# Patient Record
Sex: Female | Born: 1970 | Race: White | Hispanic: No | Marital: Married
Health system: Southern US, Community
[De-identification: ages and names within clinical notes are randomized; demographics above are authoritative.]

---

## 2007-11-26 ENCOUNTER — Encounter: Admission: RE | Admit: 2007-11-26 | Discharge: 2007-11-26 | Payer: Self-pay | Admitting: Obstetrics & Gynecology

## 2011-09-07 ENCOUNTER — Other Ambulatory Visit: Payer: Self-pay | Admitting: Family Medicine

## 2011-09-07 DIAGNOSIS — Z1231 Encounter for screening mammogram for malignant neoplasm of breast: Secondary | ICD-10-CM

## 2011-09-20 ENCOUNTER — Ambulatory Visit: Payer: Self-pay

## 2011-09-29 ENCOUNTER — Ambulatory Visit
Admission: RE | Admit: 2011-09-29 | Discharge: 2011-09-29 | Disposition: A | Discharge status: Home / Self Care | Payer: 59 | Source: Ambulatory Visit | Attending: Family Medicine | Admitting: Family Medicine

## 2011-09-29 DIAGNOSIS — Z1231 Encounter for screening mammogram for malignant neoplasm of breast: Secondary | ICD-10-CM

## 2012-10-07 ENCOUNTER — Other Ambulatory Visit: Payer: Self-pay

## 2012-10-07 DIAGNOSIS — Z1231 Encounter for screening mammogram for malignant neoplasm of breast: Secondary | ICD-10-CM

## 2012-11-05 ENCOUNTER — Ambulatory Visit
Admission: RE | Admit: 2012-11-05 | Discharge: 2012-11-05 | Disposition: A | Discharge status: Home / Self Care | Payer: 59 | Source: Ambulatory Visit

## 2012-11-05 DIAGNOSIS — Z1231 Encounter for screening mammogram for malignant neoplasm of breast: Secondary | ICD-10-CM

## 2013-12-23 ENCOUNTER — Other Ambulatory Visit: Payer: Self-pay

## 2013-12-23 DIAGNOSIS — Z1231 Encounter for screening mammogram for malignant neoplasm of breast: Secondary | ICD-10-CM

## 2013-12-24 ENCOUNTER — Ambulatory Visit
Admission: RE | Admit: 2013-12-24 | Discharge: 2013-12-24 | Disposition: A | Discharge status: Home / Self Care | Payer: 59 | Source: Ambulatory Visit

## 2013-12-24 ENCOUNTER — Encounter (INDEPENDENT_AMBULATORY_CARE_PROVIDER_SITE_OTHER): Payer: Self-pay

## 2013-12-24 DIAGNOSIS — Z1231 Encounter for screening mammogram for malignant neoplasm of breast: Secondary | ICD-10-CM

## 2013-12-30 ENCOUNTER — Other Ambulatory Visit: Payer: Self-pay | Admitting: Lactation Services

## 2013-12-30 ENCOUNTER — Other Ambulatory Visit: Payer: Self-pay | Admitting: Family Medicine

## 2013-12-30 DIAGNOSIS — R928 Other abnormal and inconclusive findings on diagnostic imaging of breast: Secondary | ICD-10-CM

## 2014-01-05 ENCOUNTER — Other Ambulatory Visit: Payer: Self-pay | Admitting: Family Medicine

## 2014-01-05 DIAGNOSIS — R928 Other abnormal and inconclusive findings on diagnostic imaging of breast: Secondary | ICD-10-CM

## 2014-01-07 ENCOUNTER — Ambulatory Visit
Admission: RE | Admit: 2014-01-07 | Discharge: 2014-01-07 | Disposition: A | Discharge status: Home / Self Care | Payer: BC Managed Care – PPO | Source: Ambulatory Visit | Attending: Family Medicine | Admitting: Family Medicine

## 2014-01-07 DIAGNOSIS — R928 Other abnormal and inconclusive findings on diagnostic imaging of breast: Secondary | ICD-10-CM

## 2015-02-23 ENCOUNTER — Other Ambulatory Visit: Payer: Self-pay

## 2015-02-23 DIAGNOSIS — Z1231 Encounter for screening mammogram for malignant neoplasm of breast: Secondary | ICD-10-CM

## 2015-03-04 ENCOUNTER — Ambulatory Visit: Payer: Self-pay

## 2015-03-09 ENCOUNTER — Ambulatory Visit
Admission: RE | Admit: 2015-03-09 | Discharge: 2015-03-09 | Disposition: A | Discharge status: Home / Self Care | Payer: BLUE CROSS/BLUE SHIELD | Source: Ambulatory Visit

## 2015-03-09 DIAGNOSIS — Z1231 Encounter for screening mammogram for malignant neoplasm of breast: Secondary | ICD-10-CM

## 2016-05-01 ENCOUNTER — Other Ambulatory Visit: Payer: Self-pay | Admitting: Nurse Practitioner

## 2016-05-01 DIAGNOSIS — Z1231 Encounter for screening mammogram for malignant neoplasm of breast: Secondary | ICD-10-CM

## 2016-05-29 ENCOUNTER — Ambulatory Visit: Payer: BLUE CROSS/BLUE SHIELD

## 2016-07-10 ENCOUNTER — Ambulatory Visit: Payer: BLUE CROSS/BLUE SHIELD

## 2016-07-14 ENCOUNTER — Ambulatory Visit: Payer: BLUE CROSS/BLUE SHIELD

## 2016-07-25 ENCOUNTER — Ambulatory Visit
Admission: RE | Admit: 2016-07-25 | Discharge: 2016-07-25 | Disposition: A | Discharge status: Home / Self Care | Payer: BLUE CROSS/BLUE SHIELD | Source: Ambulatory Visit | Attending: Nurse Practitioner | Admitting: Nurse Practitioner

## 2016-07-25 DIAGNOSIS — Z1231 Encounter for screening mammogram for malignant neoplasm of breast: Secondary | ICD-10-CM

## 2017-08-03 ENCOUNTER — Other Ambulatory Visit: Payer: Self-pay | Admitting: Nurse Practitioner

## 2017-08-03 DIAGNOSIS — Z1231 Encounter for screening mammogram for malignant neoplasm of breast: Secondary | ICD-10-CM

## 2017-08-31 ENCOUNTER — Ambulatory Visit
Admission: RE | Admit: 2017-08-31 | Discharge: 2017-08-31 | Disposition: A | Discharge status: Home / Self Care | Payer: BLUE CROSS/BLUE SHIELD | Source: Ambulatory Visit | Attending: Nurse Practitioner | Admitting: Nurse Practitioner

## 2017-08-31 DIAGNOSIS — Z1231 Encounter for screening mammogram for malignant neoplasm of breast: Secondary | ICD-10-CM

## 2018-11-20 ENCOUNTER — Other Ambulatory Visit: Payer: Self-pay | Admitting: Family Medicine

## 2018-11-20 DIAGNOSIS — Z1231 Encounter for screening mammogram for malignant neoplasm of breast: Secondary | ICD-10-CM

## 2018-12-21 ENCOUNTER — Other Ambulatory Visit: Payer: Self-pay

## 2018-12-21 ENCOUNTER — Ambulatory Visit
Admission: RE | Admit: 2018-12-21 | Discharge: 2018-12-21 | Disposition: A | Discharge status: Home / Self Care | Payer: 59 | Source: Ambulatory Visit | Attending: Family Medicine | Admitting: Family Medicine

## 2018-12-21 DIAGNOSIS — Z1231 Encounter for screening mammogram for malignant neoplasm of breast: Secondary | ICD-10-CM

## 2020-10-25 IMAGING — MG DIGITAL SCREENING BILATERAL MAMMOGRAM WITH TOMO AND CAD
8 series · 8 of 24 positions shown · non-contrast
Comparison: Previous exam(s).

CLINICAL DATA: Screening.

EXAM:
DIGITAL SCREENING BILATERAL MAMMOGRAM WITH TOMO AND CAD

[L MLO synth-2D]
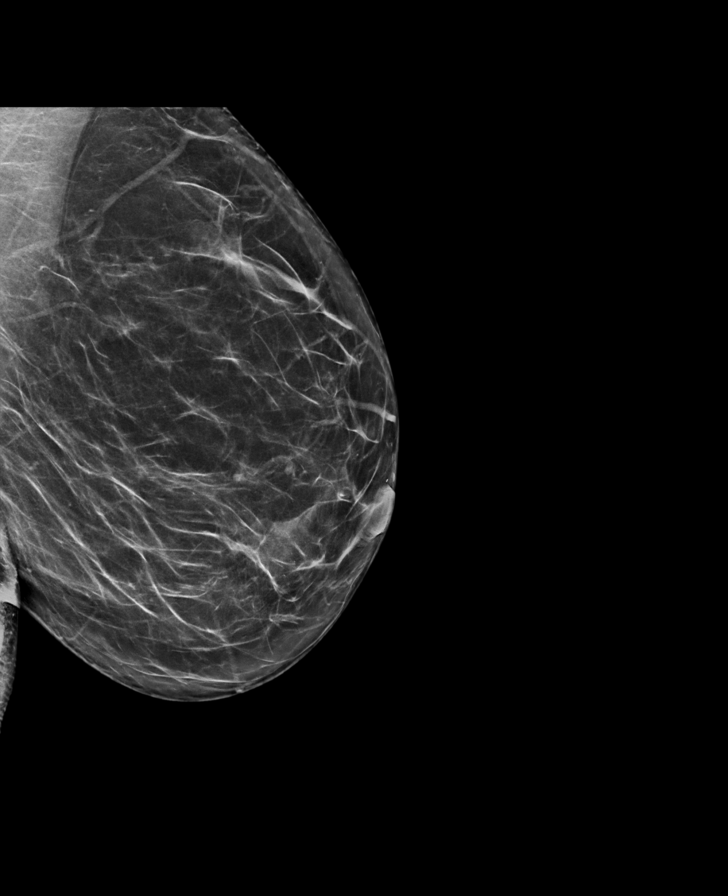

[R MLO synth-2D]
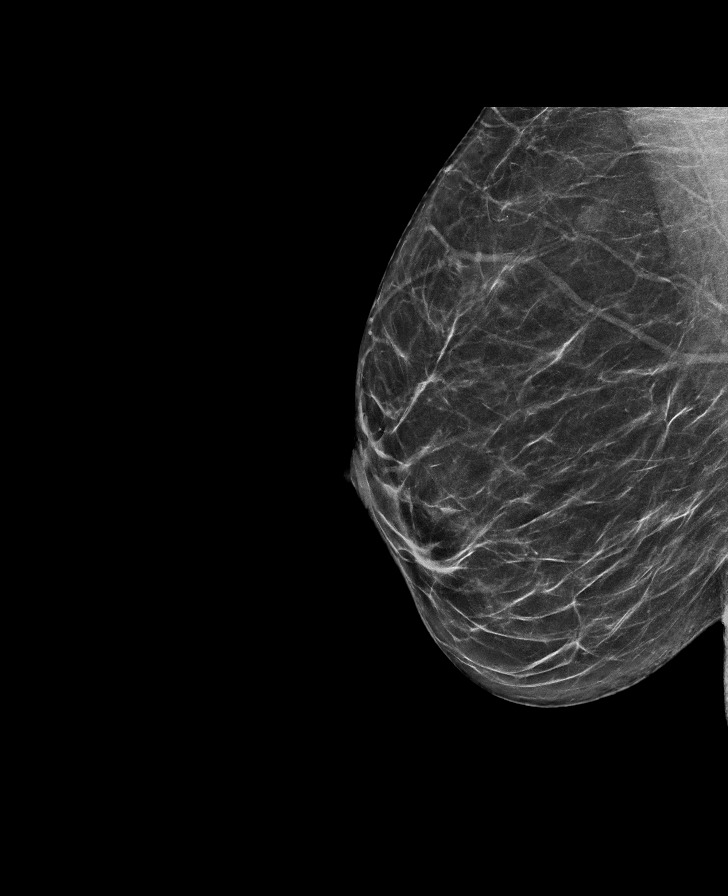

[R CC synth-2D]
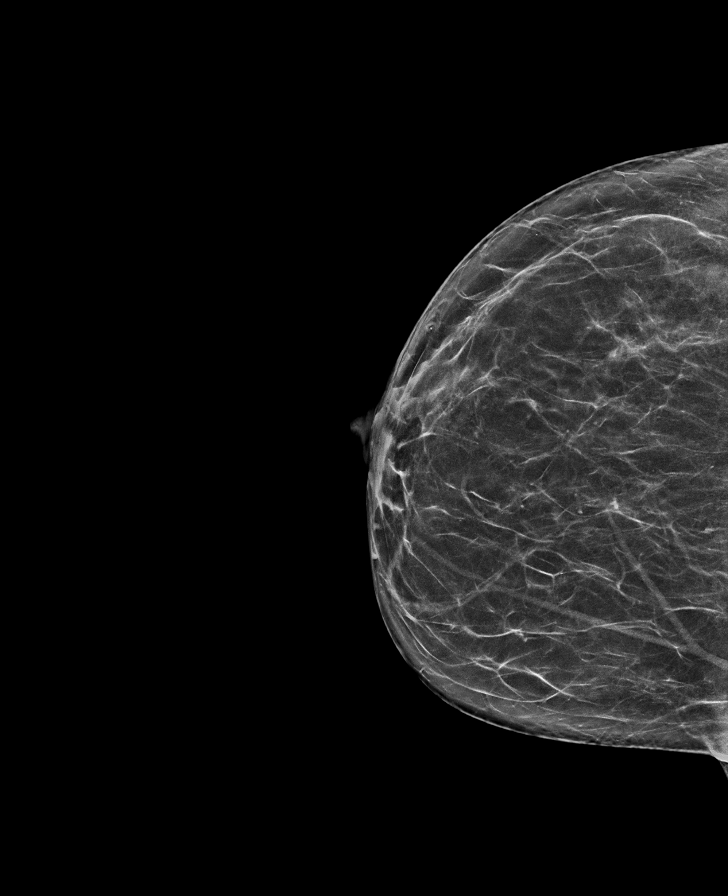

[L CC synth-2D]
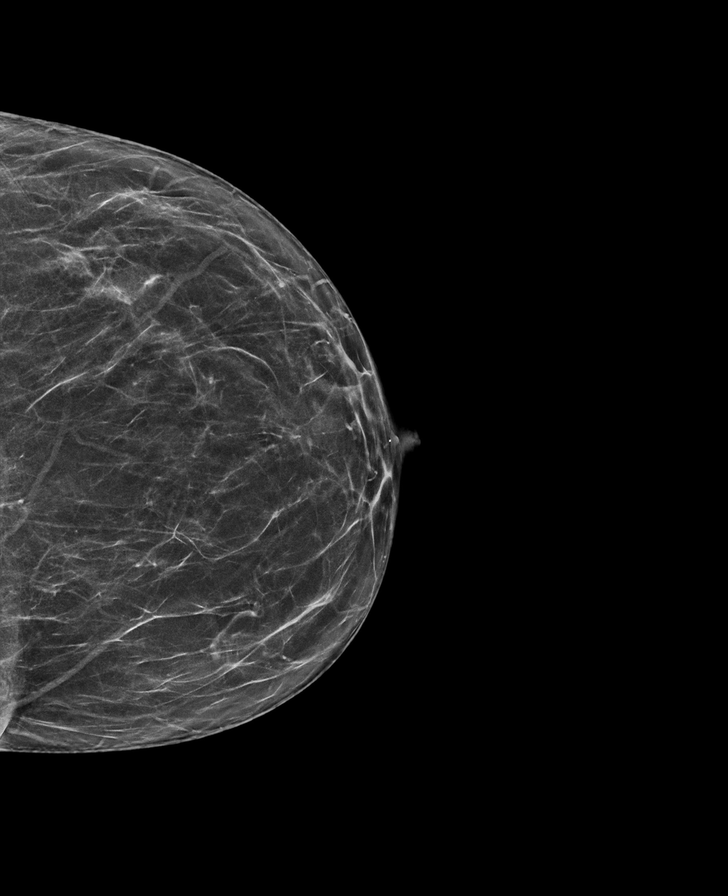

[L MLO tomo · tomo slice 38/75.0]
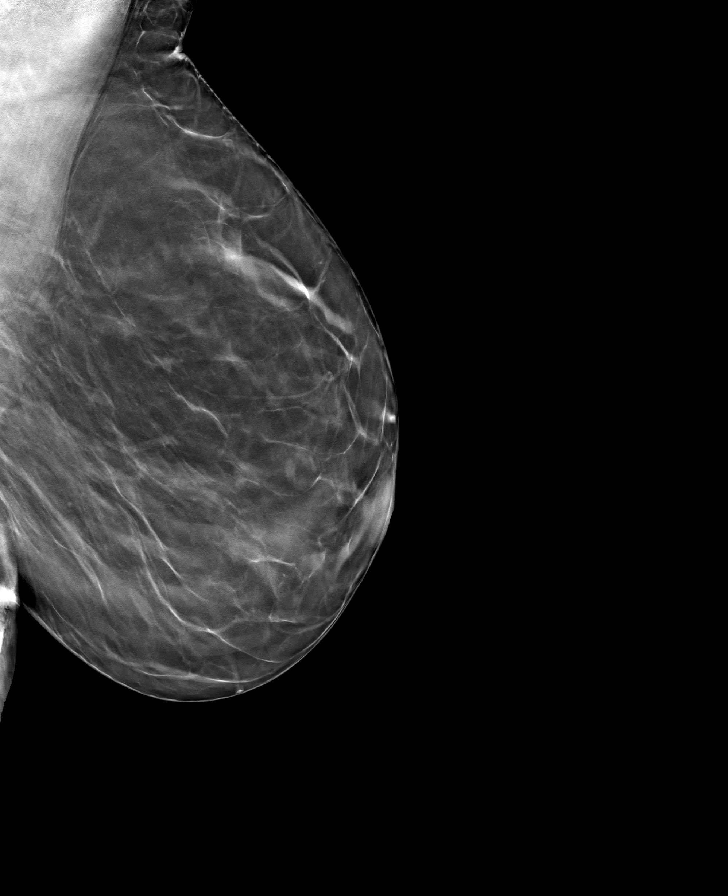

[R MLO tomo · tomo slice 37/72.0]
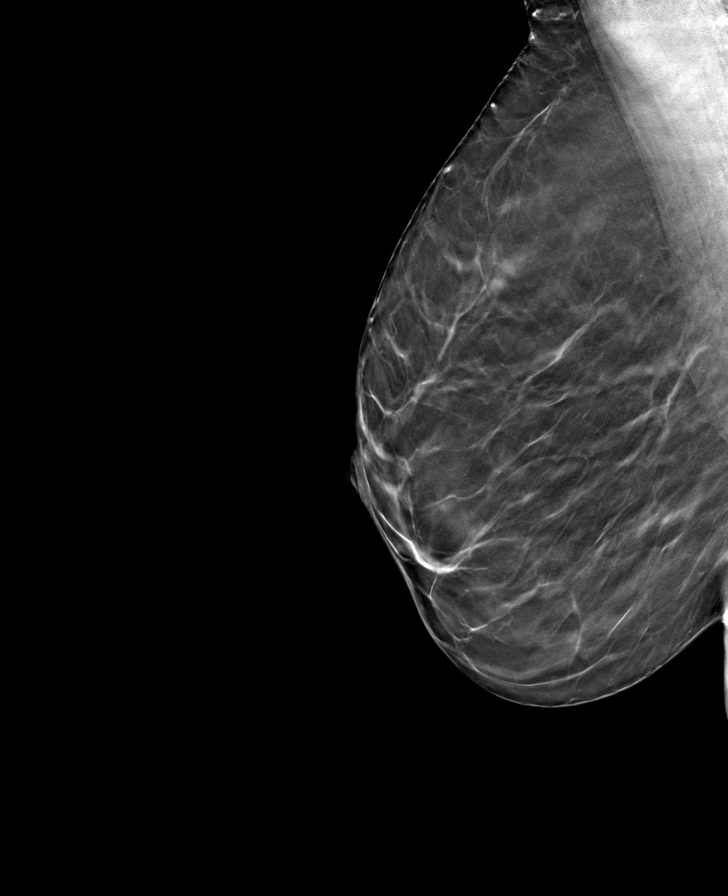

[L CC tomo · tomo slice 33/66.0]
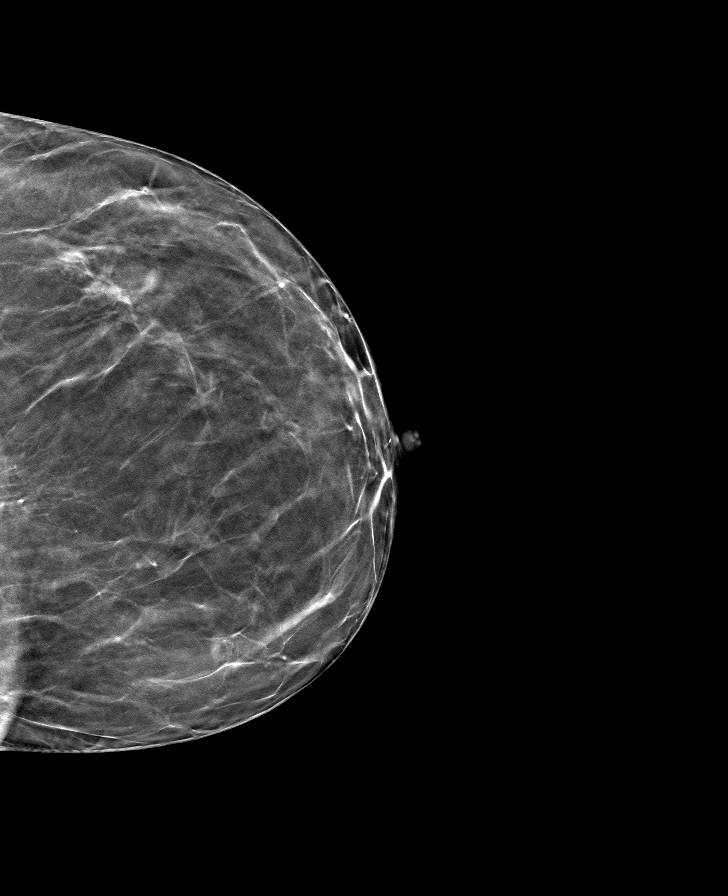

[R CC tomo · tomo slice 35/68.0]
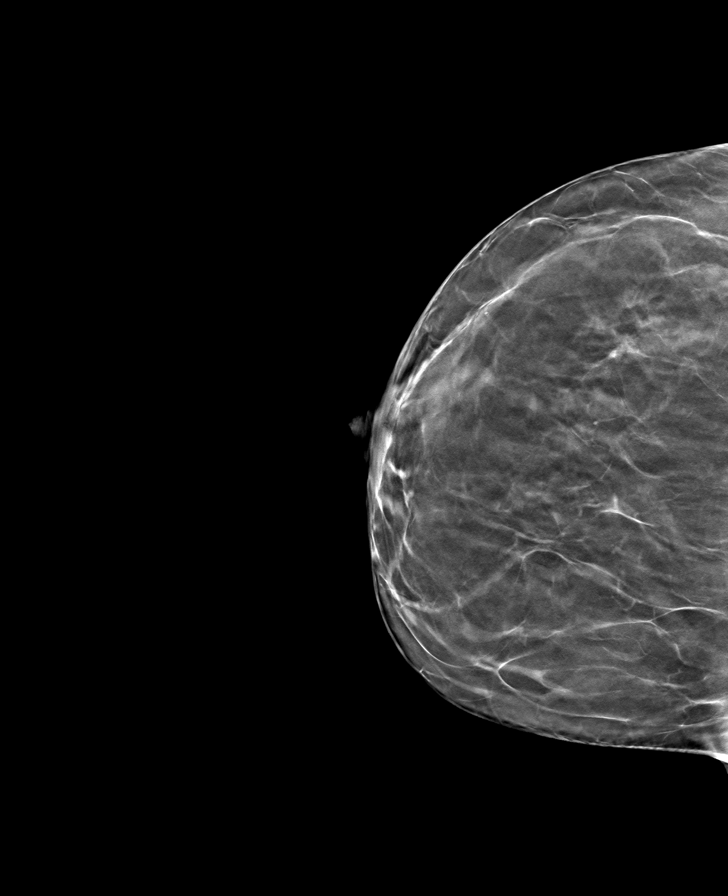

[8 of 24 positions shown; findings below may reference images not displayed]

ACR Breast Density Category b: There are scattered areas of
fibroglandular density.
FINDINGS: There are no findings suspicious for malignancy. Images were
processed with CAD.
IMPRESSION: No mammographic evidence of malignancy. A result letter of this
screening mammogram will be mailed directly to the patient.

RECOMMENDATION:
Screening mammogram in one year. (Code:[id number])

BI-RADS CATEGORY  1: Negative.
